# Patient Record
Sex: Male | Born: 1945 | Race: White | Hispanic: No | Marital: Single | Smoking: Former smoker
Health system: Southern US, Community
[De-identification: ages and names within clinical notes are randomized; demographics above are authoritative.]

---

## 2010-07-31 ENCOUNTER — Ambulatory Visit: Payer: Self-pay | Admitting: Internal Medicine

## 2010-08-21 ENCOUNTER — Inpatient Hospital Stay: Payer: Self-pay | Admitting: *Deleted

## 2010-08-25 ENCOUNTER — Inpatient Hospital Stay: Payer: Self-pay | Admitting: Internal Medicine

## 2010-08-31 ENCOUNTER — Ambulatory Visit: Payer: Self-pay | Admitting: Internal Medicine

## 2010-09-02 ENCOUNTER — Observation Stay: Payer: Self-pay | Admitting: Internal Medicine

## 2016-03-17 ENCOUNTER — Emergency Department
Admission: EM | Admit: 2016-03-17 | Discharge: 2016-03-17 | Payer: Medicare Other | Attending: Emergency Medicine | Admitting: Emergency Medicine

## 2016-03-17 ENCOUNTER — Encounter: Payer: Self-pay | Admitting: Emergency Medicine

## 2016-03-17 ENCOUNTER — Emergency Department: Payer: Medicare Other

## 2016-03-17 DIAGNOSIS — R4182 Altered mental status, unspecified: Secondary | ICD-10-CM | POA: Insufficient documentation

## 2016-03-17 DIAGNOSIS — Z87891 Personal history of nicotine dependence: Secondary | ICD-10-CM | POA: Insufficient documentation

## 2016-03-17 DIAGNOSIS — R740 Nonspecific elevation of levels of transaminase and lactic acid dehydrogenase [LDH]: Secondary | ICD-10-CM | POA: Insufficient documentation

## 2016-03-17 DIAGNOSIS — E876 Hypokalemia: Secondary | ICD-10-CM

## 2016-03-17 DIAGNOSIS — K8591 Acute pancreatitis with uninfected necrosis, unspecified: Secondary | ICD-10-CM | POA: Insufficient documentation

## 2016-03-17 DIAGNOSIS — R7989 Other specified abnormal findings of blood chemistry: Secondary | ICD-10-CM

## 2016-03-17 DIAGNOSIS — R109 Unspecified abdominal pain: Secondary | ICD-10-CM | POA: Diagnosis present

## 2016-03-17 DIAGNOSIS — R1084 Generalized abdominal pain: Secondary | ICD-10-CM

## 2016-03-17 LAB — LACTIC ACID, PLASMA
LACTIC ACID, VENOUS: 2.1 mmol/L — AB (ref 0.5–1.9)
LACTIC ACID, VENOUS: 1 mmol/L (ref 0.5–1.9)

## 2016-03-17 LAB — CBC WITH DIFFERENTIAL/PLATELET
BASOS ABS: 0 10*3/uL (ref 0–0.1)
BASOS PCT: 0 %
Eosinophils Absolute: 0 10*3/uL (ref 0–0.7)
Eosinophils Relative: 0 %
HEMATOCRIT: 40.2 % (ref 40.0–52.0)
HEMOGLOBIN: 14.1 g/dL (ref 13.0–18.0)
LYMPHS PCT: 11 %
Lymphs Abs: 1.2 10*3/uL (ref 1.0–3.6)
MCH: 33.4 pg (ref 26.0–34.0)
MCHC: 35.1 g/dL (ref 32.0–36.0)
MCV: 95.2 fL (ref 80.0–100.0)
Monocytes Absolute: 0.8 10*3/uL (ref 0.2–1.0)
Monocytes Relative: 7 %
NEUTROS ABS: 8.9 10*3/uL — AB (ref 1.4–6.5)
NEUTROS PCT: 82 %
Platelets: 206 10*3/uL (ref 150–440)
RBC: 4.22 MIL/uL — AB (ref 4.40–5.90)
RDW: 13.2 % (ref 11.5–14.5)
WBC: 10.9 10*3/uL — ABNORMAL HIGH (ref 3.8–10.6)

## 2016-03-17 LAB — ETHANOL

## 2016-03-17 LAB — SALICYLATE LEVEL: Salicylate Lvl: <7 mg/dL (ref 2.8–30.0)

## 2016-03-17 LAB — COMPREHENSIVE METABOLIC PANEL
ALK PHOS: 84 U/L (ref 38–126)
ALT: 14 U/L — ABNORMAL LOW (ref 17–63)
AST: 30 U/L (ref 15–41)
Albumin: 2.5 g/dL — ABNORMAL LOW (ref 3.5–5.0)
Anion gap: 11 (ref 5–15)
BUN: 11 mg/dL (ref 6–20)
CALCIUM: 8.5 mg/dL — AB (ref 8.9–10.3)
CHLORIDE: 92 mmol/L — AB (ref 101–111)
CO2: 29 mmol/L (ref 22–32)
CREATININE: 1.01 mg/dL (ref 0.61–1.24)
GFR calc Af Amer: >60 mL/min (ref >60)
Glucose, Bld: 125 mg/dL — ABNORMAL HIGH (ref 65–99)
Potassium: 2.6 mmol/L — CL (ref 3.5–5.1)
Sodium: 132 mmol/L — ABNORMAL LOW (ref 135–145)
Total Bilirubin: 1.7 mg/dL — ABNORMAL HIGH (ref 0.3–1.2)
Total Protein: 7.4 g/dL (ref 6.5–8.1)

## 2016-03-17 LAB — URINALYSIS, ROUTINE W REFLEX MICROSCOPIC
BACTERIA UA: NONE SEEN
BILIRUBIN URINE: NEGATIVE
GLUCOSE, UA: NEGATIVE mg/dL
HGB URINE DIPSTICK: NEGATIVE
Ketones, ur: 5 mg/dL — AB
LEUKOCYTES UA: NEGATIVE
NITRITE: NEGATIVE
PROTEIN: 30 mg/dL — AB
Specific Gravity, Urine: 1.015 (ref 1.005–1.030)
pH: 6 (ref 5.0–8.0)

## 2016-03-17 LAB — MAGNESIUM: Magnesium: 1.3 mg/dL — ABNORMAL LOW (ref 1.7–2.4)

## 2016-03-17 LAB — ACETAMINOPHEN LEVEL: Acetaminophen (Tylenol), Serum: <10 ug/mL — ABNORMAL LOW (ref 10–30)

## 2016-03-17 LAB — TROPONIN I: Troponin I: <0.03 ng/mL (ref <0.03)

## 2016-03-17 LAB — AMMONIA: Ammonia: 15 umol/L (ref 9–35)

## 2016-03-17 LAB — LIPASE, BLOOD: Lipase: 76 U/L — ABNORMAL HIGH (ref 11–51)

## 2016-03-17 MED ORDER — LORAZEPAM 2 MG PO TABS: 0.0000 mg | ORAL_TABLET | Freq: Four times a day (QID) | ORAL | Status: DC

## 2016-03-17 MED ORDER — SODIUM CHLORIDE 0.9 % IV BOLUS (SEPSIS)
1000.0000 mL | INTRAVENOUS | Status: AC
  Administered 2016-03-17: 1000 mL via INTRAVENOUS

## 2016-03-17 MED ORDER — IOPAMIDOL (ISOVUE-300) INJECTION 61%
30.0000 mL | Freq: Once | INTRAVENOUS | Status: AC | PRN
  Administered 2016-03-17: 30 mL via ORAL

## 2016-03-17 MED ORDER — ONDANSETRON HCL 4 MG/2ML IJ SOLN
INTRAMUSCULAR | Status: AC
  Administered 2016-03-17: 4 mg via INTRAVENOUS
  Filled 2016-03-17: qty 2

## 2016-03-17 MED ORDER — SODIUM CHLORIDE 0.9 % IV BOLUS (SEPSIS)
1250.0000 mL | INTRAVENOUS | Status: AC
  Administered 2016-03-17: 1250 mL via INTRAVENOUS

## 2016-03-17 MED ORDER — MORPHINE SULFATE (PF) 4 MG/ML IV SOLN: 4.0000 mg | Freq: Once | INTRAVENOUS | Status: DC

## 2016-03-17 MED ORDER — SODIUM CHLORIDE 0.9 % IV SOLN
30.0000 meq | Freq: Once | INTRAVENOUS | Status: AC
  Administered 2016-03-17: 30 meq via INTRAVENOUS
  Filled 2016-03-17: qty 15

## 2016-03-17 MED ORDER — MORPHINE SULFATE (PF) 4 MG/ML IV SOLN
INTRAVENOUS | Status: AC
  Administered 2016-03-17: 2 mg via INTRAVENOUS
  Filled 2016-03-17: qty 1

## 2016-03-17 MED ORDER — MAGNESIUM SULFATE 4 GM/100ML IV SOLN
4.0000 g | Freq: Once | INTRAVENOUS | Status: AC
  Administered 2016-03-17: 4 g via INTRAVENOUS
  Filled 2016-03-17: qty 100

## 2016-03-17 MED ORDER — ONDANSETRON HCL 4 MG/2ML IJ SOLN
4.0000 mg | INTRAMUSCULAR | Status: AC
  Administered 2016-03-17: 4 mg via INTRAVENOUS

## 2016-03-17 MED ORDER — THIAMINE HCL 100 MG/ML IJ SOLN
100.0000 mg | Freq: Once | INTRAMUSCULAR | Status: AC
  Administered 2016-03-17: 100 mg via INTRAVENOUS
  Filled 2016-03-17: qty 2

## 2016-03-17 MED ORDER — IOPAMIDOL (ISOVUE-300) INJECTION 61%
100.0000 mL | Freq: Once | INTRAVENOUS | Status: AC | PRN
  Administered 2016-03-17: 100 mL via INTRAVENOUS

## 2016-03-17 MED ORDER — LORAZEPAM 2 MG PO TABS: 0.0000 mg | ORAL_TABLET | Freq: Two times a day (BID) | ORAL | Status: DC

## 2016-03-17 MED ORDER — FOLIC ACID 5 MG/ML IJ SOLN
1.0000 mg | Freq: Every day | INTRAMUSCULAR | Status: DC
  Administered 2016-03-17: 1 mg via INTRAVENOUS
  Filled 2016-03-17 (×2): qty 0.2

## 2016-03-17 MED ORDER — SODIUM CHLORIDE 0.9 % IV SOLN: Freq: Once | INTRAVENOUS | Status: DC

## 2016-03-17 MED ORDER — VITAMIN B-1 100 MG PO TABS: 100.0000 mg | ORAL_TABLET | Freq: Every day | ORAL | Status: DC

## 2016-03-17 MED ORDER — MORPHINE SULFATE (PF) 4 MG/ML IV SOLN
2.0000 mg | Freq: Once | INTRAVENOUS | Status: AC
  Administered 2016-03-17: 2 mg via INTRAVENOUS

## 2016-03-17 MED ORDER — FOLIC ACID 5 MG/ML IJ SOLN: 1.0000 mg | Freq: Every day | INTRAMUSCULAR | Status: DC

## 2016-03-17 NOTE — ED Notes (Signed)
Pt resting in bed watching TV. States he does not understand why "we are torturing [him]" by not letting him have anything to drink.

## 2016-03-17 NOTE — ED Notes (Signed)
Rate changed to 9850mL/hr due to pt c/o burning. Will continue to monitor for further patient needs.

## 2016-03-17 NOTE — ED Notes (Signed)
Pt given oral contrast by Georgiann HahnKat, CT tech. Pt sitting up in bed drinking contrast at this time. This RN discussed with patient the need to obtain a urine sample, pt states "It's up to you", will monitor for urine sample and speak with MD regarding in and out cath if needed.

## 2016-03-17 NOTE — ED Notes (Addendum)
Pt resting in bed at this time. NAD noted at this time. Pt remains confused, watching TV, calls out to this RN asking for something to drink, this RN explained to patient that per MD he could not have anything to drink.

## 2016-03-17 NOTE — ED Notes (Signed)
Pt given urinal to attempt to obtain urine sample by Paulette, EDT.

## 2016-03-17 NOTE — ED Provider Notes (Addendum)
[facility] Emergency Department Provider Note  ____________________________________________   None    (approximate)  I have reviewed the triage vital signs and the nursing notes.   HISTORY  Chief Complaint Altered Mental Status  Patient appears acutely confused and is only able to provide a limited history  HPI Joshua Washington is a 70 y.o. male who was brought by EMS for further evaluation of abdominal pain.  It is unclear who called EMS this time.  EMS reports that all juice bottles filled with urine or lid or around the patient's bed as were empty cans of beer.  The patient states that he had Salmonella 9 weeks ago due to eating bad food and has been having persistent diarrhea although he states this is been a little bit better.  He has also had vomiting recently.  He reports no medical history and is intermittently cooperative.  It is unclear what his baseline mental status is.  He denies any recent fever or chills, chest pain, or shortness of breath.  He states that his abdomen has been hurting "for a long time" but is unable to further quantify.  He states it hurts when we push on it and nothing in particular makes it better.   History reviewed. No pertinent past medical history.  There are no active problems to display for this patient.   History reviewed. No pertinent surgical history.  Prior to Admission medications   Not on File    Allergies Patient has no known allergies.  No family history on file.  Social History Social History  Substance Use Topics  . Smoking status: Former Games developer  . Smokeless tobacco: Never Used  . Alcohol use Yes     Comment: unknown how much patient drinks or when last drink was.    Review of Systems (limited by confusion) Level 5 caveat:  AMS/confusion, unwilling or unable to provide reliable/complete ROS ____________________________________________   PHYSICAL EXAM:  VITAL SIGNS: ED Triage Vitals  Enc  Vitals Group     BP      Pulse      Resp      Temp      Temp src      SpO2      Weight      Height      Head Circumference      Peak Flow      Pain Score      Pain Loc      Pain Edu?      Excl. in GC?     Constitutional: Alert, oriented to self and place.  Extremely disheveled, terrible hygiene, malodorous. Eyes: Conjunctivae are normal. PERRL. EOMI. Head: Atraumatic. Nose: No congestion/rhinnorhea. Mouth/Throat: Edentulous.  Mucous membranes are dry.   Neck: No stridor.  No meningeal signs.   Cardiovascular: Tachycardia in 130s, regular rhythm. Good peripheral circulation. Grossly normal heart sounds. Respiratory: Normal respiratory effort.  No retractions. Lungs CTAB. Gastrointestinal: Soft with generalized tenderness throughout abdomen with possibly mild distention.   Musculoskeletal: No lower extremity tenderness nor edema. No gross deformities of extremities. Neurologic:  Slurred speech. No gross focal neurologic deficits are appreciated.  Skin:  Skin is warm, dry and intact. No rash noted.   ____________________________________________   LABS (all labs ordered are listed, but only abnormal results are displayed)  Labs Reviewed  COMPREHENSIVE METABOLIC PANEL - Abnormal; Notable for the following:       Result Value   Sodium 132 (*)    Potassium 2.6 (*)  Chloride 92 (*)    Glucose, Bld 125 (*)    Calcium 8.5 (*)    Albumin 2.5 (*)    ALT 14 (*)    Total Bilirubin 1.7 (*)    All other components within normal limits  LIPASE, BLOOD - Abnormal; Notable for the following:    Lipase 76 (*)    All other components within normal limits  ACETAMINOPHEN LEVEL - Abnormal; Notable for the following:    Acetaminophen (Tylenol), Serum <10 (*)    All other components within normal limits  LACTIC ACID, PLASMA - Abnormal; Notable for the following:    Lactic Acid, Venous 2.1 (*)    All other components within normal limits  CBC WITH DIFFERENTIAL/PLATELET - Abnormal;  Notable for the following:    WBC 10.9 (*)    RBC 4.22 (*)    Neutro Abs 8.9 (*)    All other components within normal limits  URINALYSIS, ROUTINE W REFLEX MICROSCOPIC - Abnormal; Notable for the following:    Color, Urine AMBER (*)    APPearance CLEAR (*)    Ketones, ur 5 (*)    Protein, ur 30 (*)    Squamous Epithelial / LPF 0-5 (*)    All other components within normal limits  MAGNESIUM - Abnormal; Notable for the following:    Magnesium 1.3 (*)    All other components within normal limits  CULTURE, BLOOD (ROUTINE X 2)  CULTURE, BLOOD (ROUTINE X 2)  ETHANOL  SALICYLATE LEVEL  AMMONIA  TROPONIN I  LACTIC ACID, PLASMA   ____________________________________________  EKG  ED ECG REPORT I, Zowie Lundahl, the attending physician, personally viewed and interpreted this ECG.  Date: 03/17/2016 EKG Time: 14:05 Rate: 126 Rhythm: sinus tachycardia QRS Axis: normal Intervals: normal ST/T Wave abnormalities: Some apparent ST segment elevation in lead V2 but there are no reciprocal changes and is only in one lead.  Overall there is no evidence of acute ischemia Conduction Disturbances: none   ____________________________________________  RADIOLOGY   Ct Head Wo Contrast  Result Date: 03/17/2016 CLINICAL DATA:  Altered mental status with confusion. Recent falls. Generalized abdominal pain. EXAM: CT HEAD WITHOUT CONTRAST TECHNIQUE: Contiguous axial images were obtained from the base of the skull through the vertex without intravenous contrast. COMPARISON:  CT head 08/21/2010. FINDINGS: Brain: There is stable mild generalized atrophy with prominence of the subarachnoid spaces, especially adjacent to the frontal lobes. No evidence of acute intracranial hemorrhage, mass lesion, brain edema or new extra-axial fluid collection. There is no hydrocephalus. There are stable mild chronic small vessel ischemic changes in the periventricular white matter. There is no CT evidence of acute  cortical infarction. Vascular: Intracranial vascular calcifications. No hyperdense vascular sign. Skull: Negative for fracture or focal lesion. Sinuses/Orbits: The visualized paranasal sinuses and mastoid air cells are clear. No orbital abnormalities are seen. Other: None. IMPRESSION: No acute intracranial findings. Stable atrophy and chronic small vessel ischemic changes. Electronically Signed   By: Carey Bullocks M.D.   On: 03/17/2016 19:54   Ct Abdomen Pelvis W Contrast  Result Date: 03/17/2016 CLINICAL DATA:  Abdominal pain.  Elevated lactate. EXAM: CT ABDOMEN AND PELVIS WITH CONTRAST TECHNIQUE: Multidetector CT imaging of the abdomen and pelvis was performed using the standard protocol following bolus administration of intravenous contrast. CONTRAST:  ISOVUE-300 IOPAMIDOL (ISOVUE-300) INJECTION 61% COMPARISON:  CT chest 09/01/2010. FINDINGS: Lower chest: No pulmonary nodules. No visible pleural or pericardial effusion. Small hiatal hernia. Hepatobiliary: Normal hepatic size and contours without focal  liver lesion. No perihepatic ascites. There is mild intrahepatic biliary dilatation, as well as extrahepatic biliary dilatation with the common bile duct measuring up to 1.3 cm. Gallbladder is distended without cholelithiasis. Pancreas: There is loss of normal pancreatic enhancement in the pancreatic head and body. There is a heterogeneous, predominantly low density collection within and surrounding the pancreas that measures approximately 1.7 x 6.4 cm. Portions of the collection appear encapsulated, but most of it is not encapsulated. The collection extends laterally to the splenic hilum. Spleen: Normal. Adrenals/Urinary Tract: Normal adrenal glands. Nonobstructing left nephrolithiasis measures 6 mm. No hydronephrosis or solid renal mass. Stomach/Bowel: No evidence of small-bowel obstruction or acute inflammation. Normal appendix. Vascular/Lymphatic: There is atherosclerotic calcification of the non  aneurysmal abdominal aorta. The main portal vein is patent. The superior mesenteric vein is not visualized and may be occluded. The splenic vein is narrowed but appears to be patent. No abdominal or pelvic adenopathy. Reproductive: There are prostatic calcifications with mild enlargement of the prostate gland. Seminal vesicles are normal. There is a small amount of free fluid in the pelvis. Musculoskeletal: There is multilevel lumbar facet arthrosis and osteophytosis. No focal lytic or blastic lesions. There are prominent Schmorl's nodes at multiple levels. There is grade 1 anterolisthesis of L5 on S1 secondary to bilateral defects of the pars interarticularis. Normal visualized extrathoracic and extraperitoneal soft tissues. Other: No contributory non-categorized findings. IMPRESSION: 1. Areas of necrosis within the pancreatic head and body with associated large, predominantly non-encapsulated pancreatic/peripancreatic fluid collection, most consistent with an acute necrotic collection. 2. Nonvisualization of the superior mesenteric vein, which may be occluded. 3. Intra- and extrahepatic biliary dilatation with common bile duct measuring 13 mm. 4. Aortic atherosclerosis. Electronically Signed   By: Deatra Robinson M.D.   On: 03/17/2016 20:09   Dg Chest Portable 1 View  Result Date: 03/17/2016 CLINICAL DATA:  Left quadrant abdominal pain. EXAM: PORTABLE CHEST 1 VIEW COMPARISON:  Aug 29, 2010 FINDINGS: The heart size and mediastinal contours are within normal limits. Both lungs are clear. The visualized skeletal structures are unremarkable. IMPRESSION: No active disease. Electronically Signed   By: Gerome Sam III M.D   On: 03/17/2016 14:37    ____________________________________________   PROCEDURES  Procedure(s) performed:   .Critical Care Performed by: Loleta Rose Authorized by: Loleta Rose   Critical care provider statement:    Critical care time (minutes):  60   Critical care time was  exclusive of:  Separately billable procedures and treating other patients   Critical care was necessary to treat or prevent imminent or life-threatening deterioration of the following conditions:  Endocrine crisis and metabolic crisis   Critical care was time spent personally by me on the following activities:  Development of treatment plan with patient or surrogate, discussions with consultants, evaluation of patient's response to treatment, examination of patient, obtaining history from patient or surrogate, ordering and performing treatments and interventions, ordering and review of laboratory studies, ordering and review of radiographic studies, pulse oximetry, re-evaluation of patient's condition and review of old charts      Critical Care performed:  Yes  ____________________________________________   INITIAL IMPRESSION / ASSESSMENT AND PLAN / ED COURSE  Pertinent labs & imaging results that were available during my care of the patient were reviewed by me and considered in my medical decision making (see chart for details).  Patient altered and unable to provide much history, states he "brings hell with me wherever I go."  Tachycardia, disheveled, dirty, drinking large  amount of beer at home per EMS.  Broad evaluation/workup including early administration of thiamine and folic, NS bolus, extensive lab work.   Clinical Course as of Mar 18 2111  Wynelle LinkSun Mar 17, 2016  1558 The patient's labs are notable for a mildly elevated lipase at 76.  A lactic acid of 2.1, potassium of 2.6, and otherwise essentially normal LFTs except for a slight elevation of bilirubin of 1.7.  He does not have an elevated ammonia and has no significant leukocytosis.  He is extremely tenderness to palpation of the abdomen which may be consistent with pancreatitis.  We will evaluate with CT scan of the abdomen and pelvis, continue with 30 mL per kilogram of normal saline, and I anticipate admission.  HR improved to about  115-120 after 1L NS.  Patient did receive his thiamine and folic acid.  No evidence of a colic ketoacidosis with a normal anion gap.  [CF]  1837 UA unremarkable  [CF]  1839 Ethanol negative, questionable ETOH withdrawal symptoms.  Will monitor carefully for need for benzos.  [CF]  2021 Multiple abnormalities on CT scan.  Remains hemodynamically stable at this time.  Will admit to hospitalist. CT ABDOMEN PELVIS W CONTRAST [CF]  2024 Due to multiple critically ill patients in the ED at the time, we do not yet have a repeat lactic acid.  I requested that it be drawn as soon as we have the opportunity.   [CF]  2025 Paged hospitalist for admission  [CF]  2028 I consider giving empiric antibiotics, but the patient has no sign of an infectious process.  He has no leukocytosis and no fever.  He has tachycardia but that is likely due to his necrotizing pancreatitis.  I do not feel that antibiotics are indicated at this point and will defer to hospitalist and GI specialist if they feel it is indicated.  However at this time there is no indication of extrapancreatic infection.  Drawing blood cultures just in case.  [CF]  2041 Spoke with hospitalist (Dr. Anne HahnWillis) who asked that I call GI to make sure they can care for the patient at this facility.  Asked Diplomatic Services operational officersecretary to page Dr. Earlean PolkaSolik.  [CF]  2056 Dr. Earlean PolkaSolik (GI) feels that the patient needs a large academic center based on the degree on complications associated with the patient's diagnosis.  He feels that [facility] nor [facility] have the capabilities to care for him.  I will call [facility] to request transfer.  [CF]  2112 [facility].  [facility].   [CF]    Clinical Course User Index [CF] Loleta Roseory Djuana Littleton, MD    ____________________________________________  FINAL CLINICAL IMPRESSION(S) / ED DIAGNOSES  Final diagnoses:  Elevated lactic acid level  Hypokalemia  Hypomagnesemia  Generalized  abdominal pain  Pancreatitis, necrotizing     MEDICATIONS GIVEN DURING THIS VISIT:  Medications  folic acid 1 mg in sodium chloride 0.9 % 50 mL IVPB (1 mg Intravenous Given 03/17/16 1515)  0.9 %  sodium chloride infusion (not administered)  LORazepam (ATIVAN) tablet 0-4 mg (not administered)    Followed by  LORazepam (ATIVAN) tablet 0-4 mg (not administered)  thiamine (VITAMIN B-1) tablet 100 mg (not administered)  thiamine (B-1) injection 100 mg (100 mg Intravenous Given 03/17/16 1515)  sodium chloride 0.9 % bolus 1,000 mL (0 mLs Intravenous Stopped 03/17/16 1559)  sodium chloride 0.9 % bolus 1,250 mL (0 mLs Intravenous Stopped 03/17/16  2056)  ondansetron (ZOFRAN) injection 4 mg (4 mg Intravenous Given 03/17/16 1555)  morphine 4 MG/ML injection 2 mg (2 mg Intravenous Given 03/17/16 1556)  potassium chloride 30 mEq in sodium chloride 0.9 % 265 mL (KCL MULTIRUN) IVPB (30 mEq Intravenous Given 03/17/16 1717)  iopamidol (ISOVUE-300) 61 % injection 30 mL (30 mLs Oral Contrast Given 03/17/16 1603)  magnesium sulfate IVPB 4 g 100 mL (4 g Intravenous New Bag/Given 03/17/16 1840)  iopamidol (ISOVUE-300) 61 % injection 100 mL (100 mLs Intravenous Contrast Given 03/17/16 1924)     NEW OUTPATIENT MEDICATIONS STARTED DURING THIS VISIT:  New Prescriptions   No medications on file    Modified Medications   No medications on file    Discontinued Medications   No medications on file     Note:  This document was prepared using Dragon voice recognition software and may include unintentional dictation errors.    Loleta Roseory Sindy Mccune, MD 03/17/16 2034    Loleta Roseory Emersynn Deatley, MD 03/17/16 2112

## 2016-03-17 NOTE — Clinical Social Work Note (Addendum)
CSW received consult concerning self-neglect. CSW contacted the patient's emergency contact as the patient is refusing to discuss his medical or living conditions with staff. The patient's brother answered and indicated that he was not aware that the patient is living in those conditions, but that he was not surprised. The CSW advised the patient's brother that due to the description by EMS and the patient's personal hygiene at this time, a mandated report would have to be made to APS. The patient's brother laughed and said "OK." CSW is following and has placed a mandated report with Misty StanleyLisa, the on-call for APS this weekend. She indicated that they will follow-up most likely 03/18/2016.  Joshua Washington, MSW, LCSW-A [phone]

## 2016-03-17 NOTE — ED Triage Notes (Signed)
Pt presents to ED via [facility]. Per EMS pt was found sitting on side of bed with a floor littered with old [employer] cans. Per EMS pt had old juice bottles that were around his bed, EMS states pt had been using them as urinals, urine was noted to be dark and cloudy. EMS reports that pt had possible salmonella approx 9 weeks ago due to patient eating food out of a can and had vomiting and diarrhea. EMS reports no known allergies and no known medical history, patient is not cooperative at this time to answer questions about medical history or allergies. EMS also reports that patient went from 15 PBR cans of beer a day to 1 can of beer a day. Pt is noted to be altered, general hygeine is considered poor at best, skin is noted to be dry and flaky all over his body. Pt has approx 3 ft dread lock in his beard, and his hair is also noted to be matted at this time. Pt states he is rude to "silly hospital people", unable to establish if this is patient's baseline at this time.

## 2016-03-17 NOTE — ED Notes (Signed)
Fall mats placed around patient's bed at this time, high fall risk bracelet placed to patient's R wrist, fall alarm placed on patient at this time.

## 2016-03-17 NOTE — ED Notes (Signed)
Report given to [place], Charity fundraiserN.

## 2016-03-17 NOTE — ED Notes (Signed)
Pt returned from CT at this time.  

## 2016-03-17 NOTE — ED Notes (Signed)
Pt taken to CT at this time.

## 2016-03-17 NOTE — ED Notes (Signed)
Pt noted to be sitting up in bed drinking contrast at this time. Pt states "the contrast is nasty", this RN encouraged patient to drink contrast. VSS at this time.

## 2016-03-22 LAB — CULTURE, BLOOD (ROUTINE X 2)
Culture: NO GROWTH
Culture: NO GROWTH

## 2018-06-25 IMAGING — CT CT HEAD W/O CM
3 series · 14 of 47 positions shown, 16 images · non-contrast
Comparison: CT head 08/21/2010.

CLINICAL DATA: Altered mental status with confusion. Recent falls.
Generalized abdominal pain.

EXAM:
CT HEAD WITHOUT CONTRAST
TECHNIQUE: Contiguous axial images were obtained from the base of the skull
through the vertex without intravenous contrast.

[Series 2: head wo · axial · 0.47mm/px · z∈[-129,-4]mm · 8 of 30 slices shown, 10 images]
[im 3/30  brain]
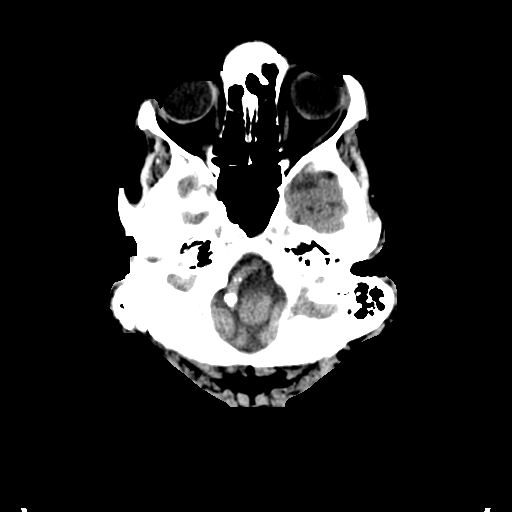
[im 3/30  bone]
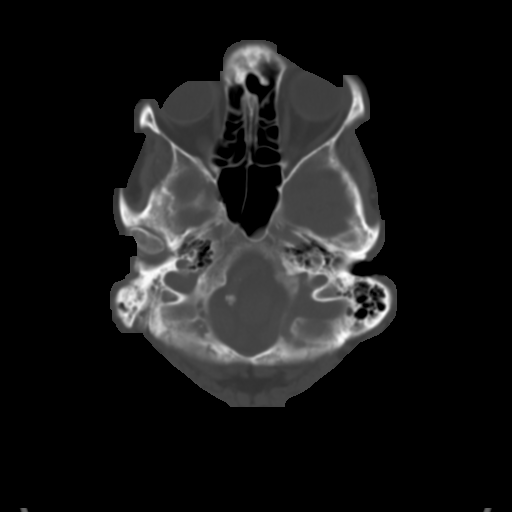
[im 7/30  brain]
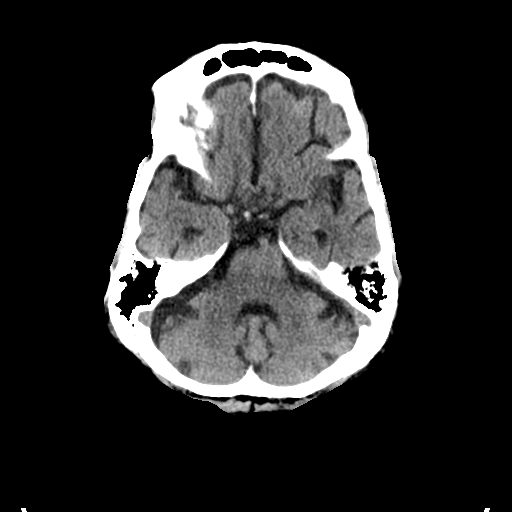
[im 10/30  brain]
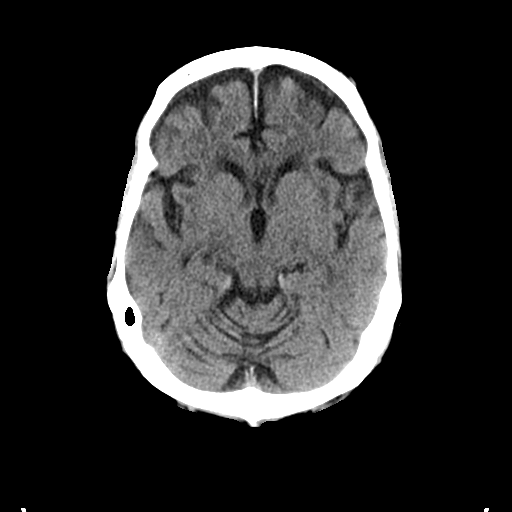
[im 14/30  brain]
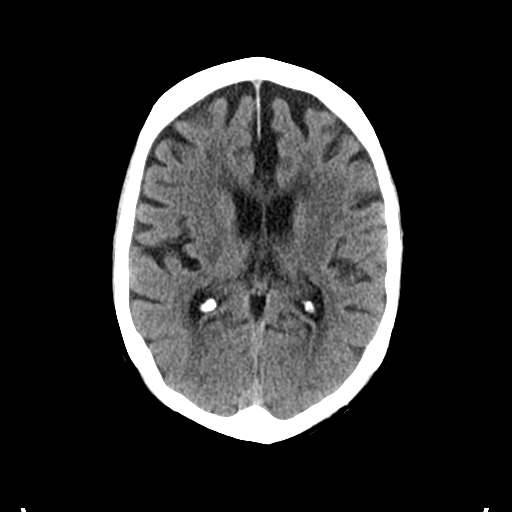
[im 17/30  brain]
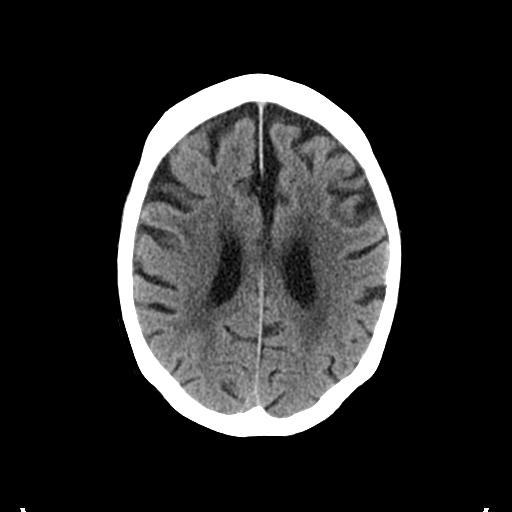
[im 17/30  bone]
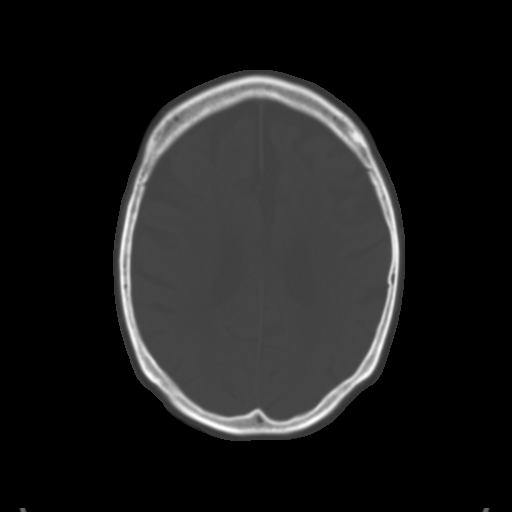
[im 21/30  brain]
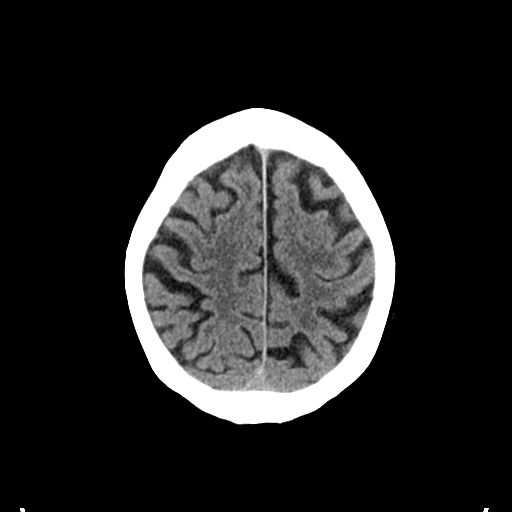
[im 24/30  brain]
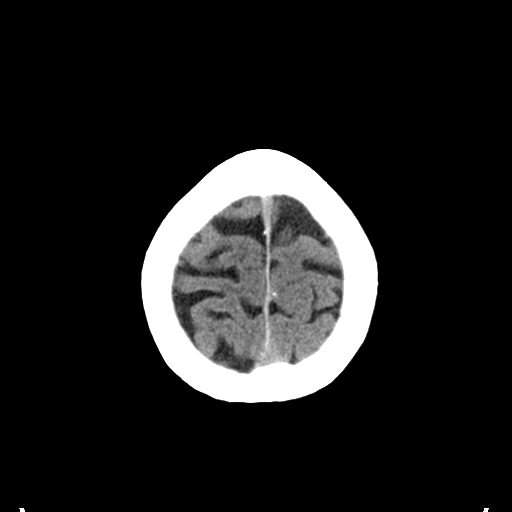
[im 28/30  brain]
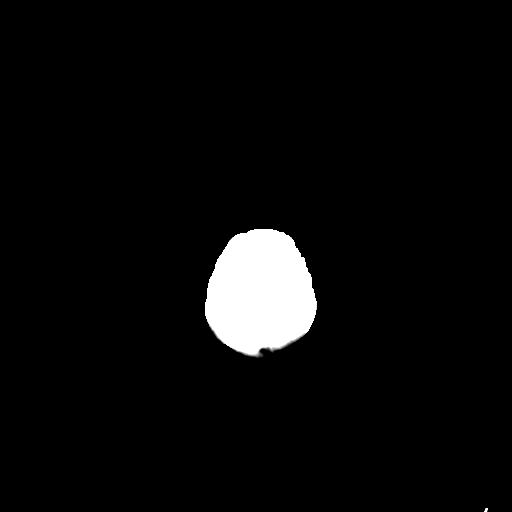

[Series 4: coronal soft tissue · coronal · 0.29mm/px · 3 of 60 slices shown]
[im 20/60  brain]
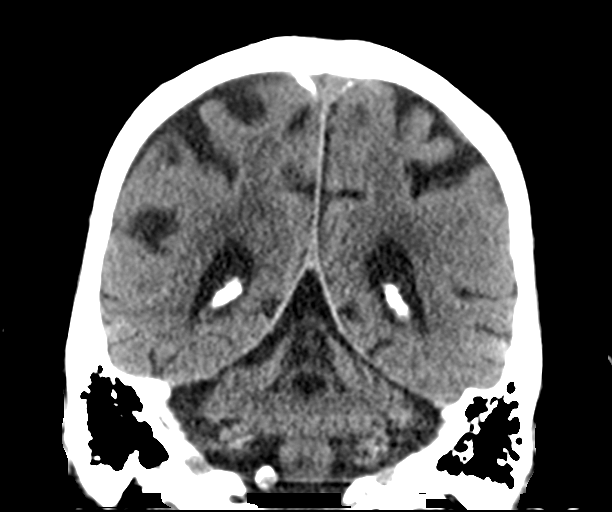
[im 27/60  brain]
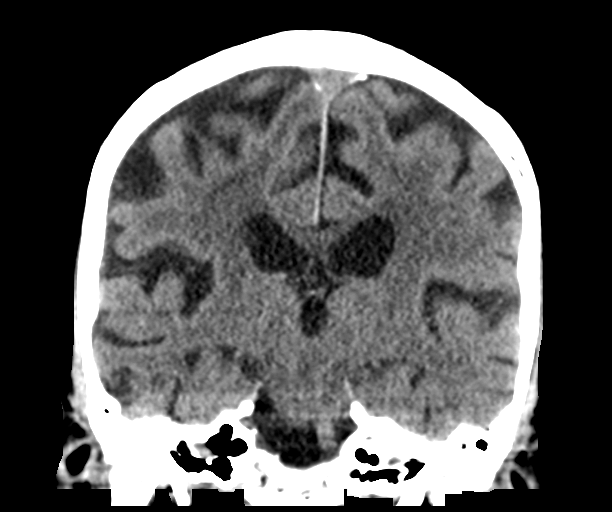
[im 33/60  brain]
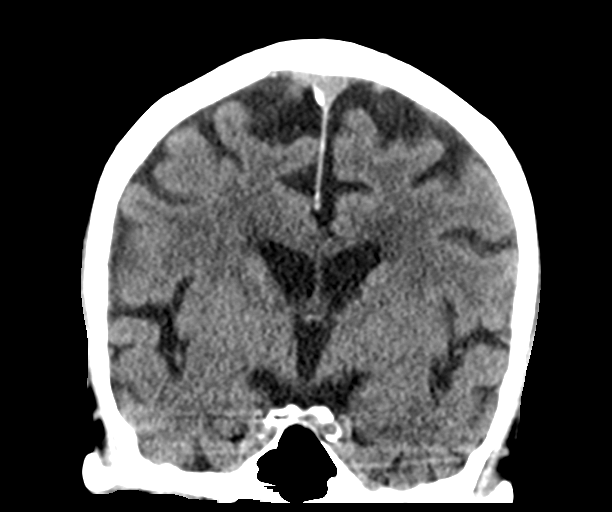

[Series 5: sagittal soft tissue · sagittal · 0.29mm/px · 3 of 46 slices shown]
[im 16/46  brain]
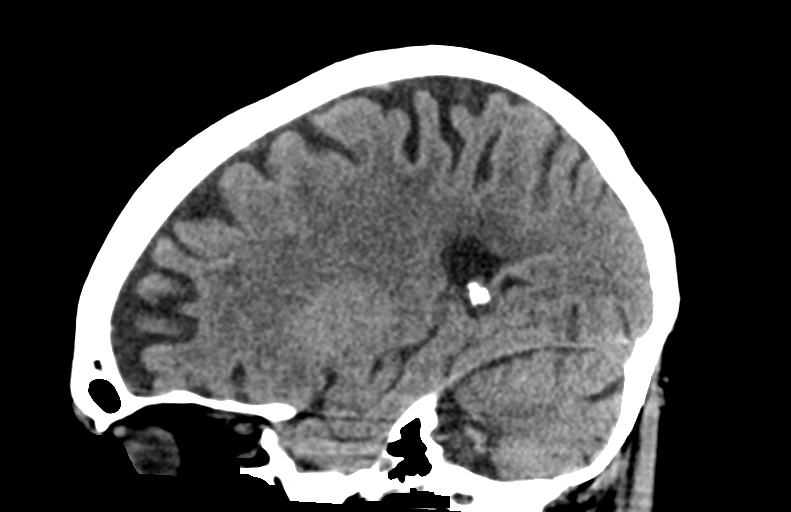
[im 23/46  brain]
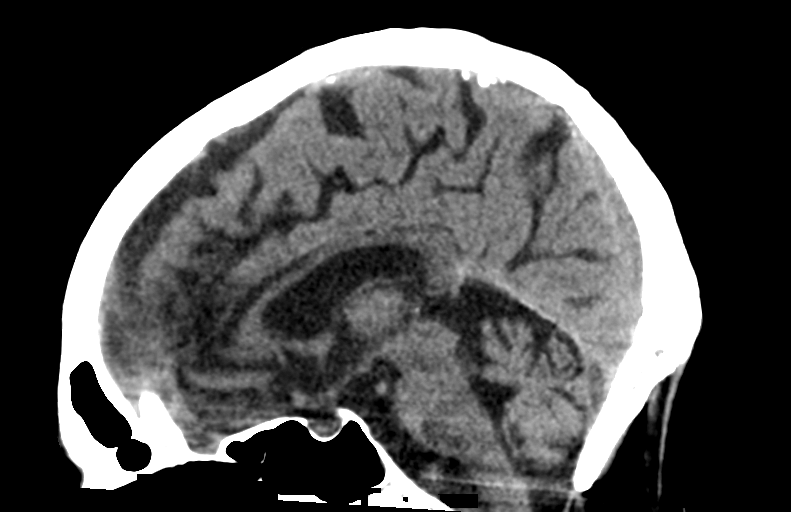
[im 31/46  brain]
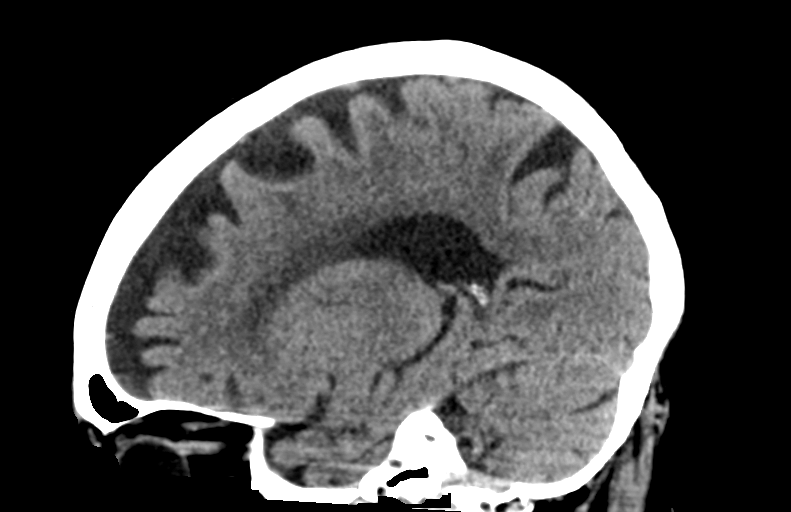

[14 of 47 positions shown; findings below may reference images not displayed]

FINDINGS: Brain: There is stable mild generalized atrophy with prominence of
the subarachnoid spaces, especially adjacent to the frontal lobes.
No evidence of acute intracranial hemorrhage, mass lesion, brain
edema or new extra-axial fluid collection. There is no
hydrocephalus. There are stable mild chronic small vessel ischemic
changes in the periventricular white matter. There is no CT evidence
of acute cortical infarction.

Vascular: Intracranial vascular calcifications. No hyperdense
vascular sign.

Skull: Negative for fracture or focal lesion.

Sinuses/Orbits: The visualized paranasal sinuses and mastoid air
cells are clear. No orbital abnormalities are seen.

Other: None.
IMPRESSION: No acute intracranial findings. Stable atrophy and chronic small
vessel ischemic changes.

## 2018-06-25 IMAGING — CT CT ABD-PELV W/ CM
2 of 5 series · 15 of 46 positions shown, 17 images · IV contrast (APPLIED)
Comparison: CT chest 09/01/2010.

CLINICAL DATA: Abdominal pain.  Elevated lactate.

EXAM:
CT ABDOMEN AND PELVIS WITH CONTRAST
TECHNIQUE: Multidetector CT imaging of the abdomen and pelvis was performed
using the standard protocol following bolus administration of
intravenous contrast.
CONTRAST:  100mL ZGTZHT-D44 IOPAMIDOL (ZGTZHT-D44) INJECTION 61%

[Series 2: routine abd/pel with · axial · 0.78mm/px · z∈[-469,-54]mm · 12 of 95 slices shown, 14 images]
[im 6/95  soft-tissue]
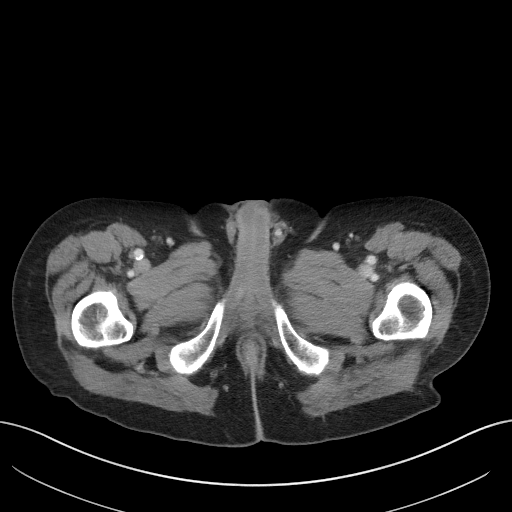
[im 6/95  bone]
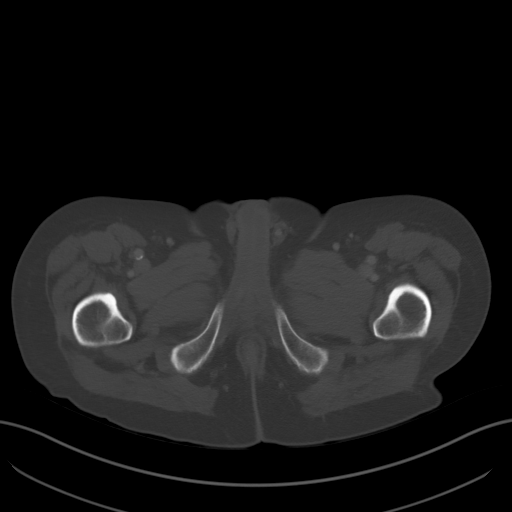
[im 16/95  soft-tissue]
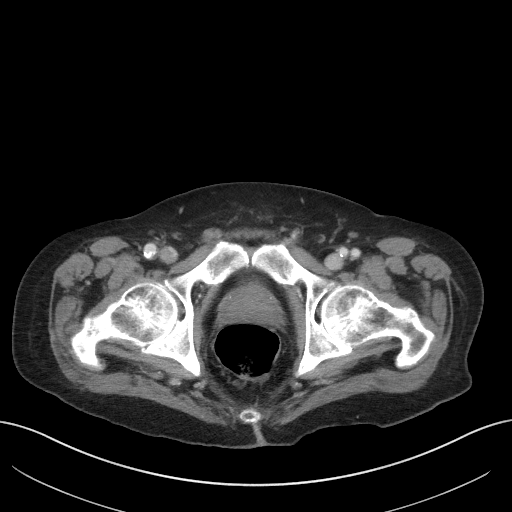
[im 21/95  soft-tissue]
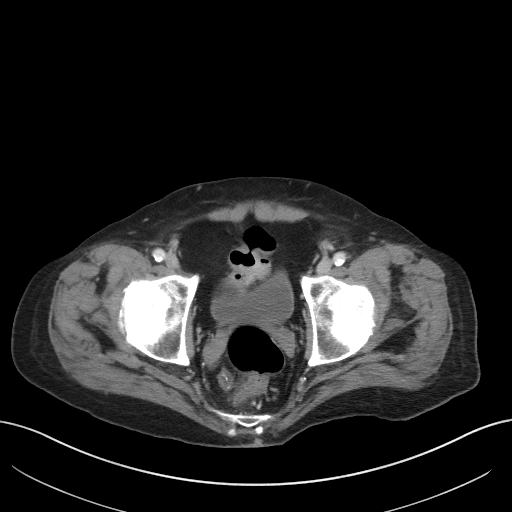
[im 27/95  soft-tissue]
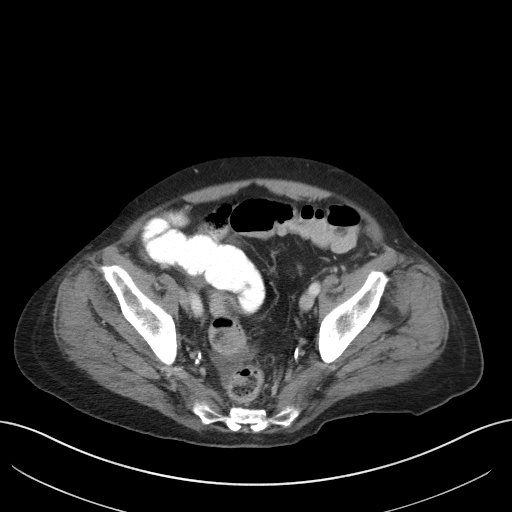
[im 37/95  soft-tissue]
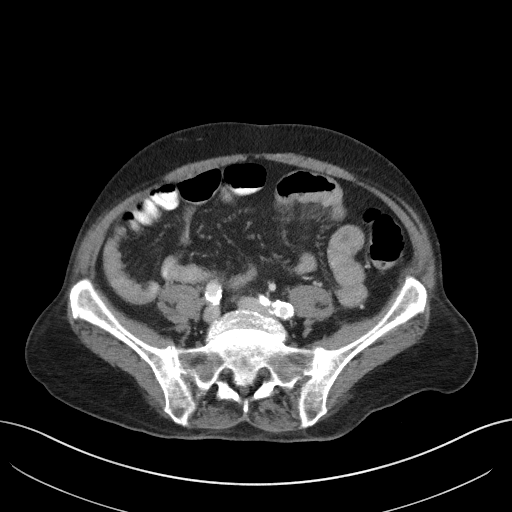
[im 42/95  soft-tissue]
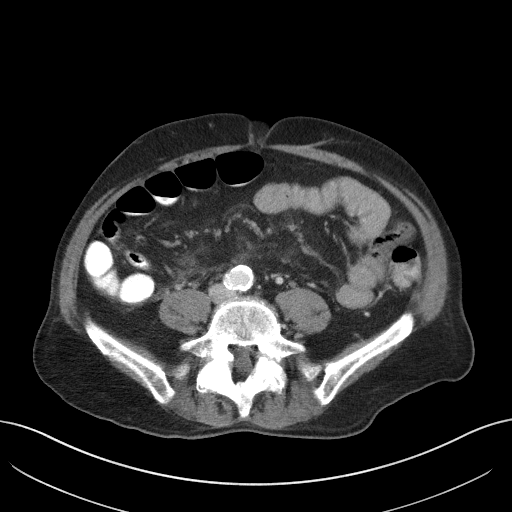
[im 53/95  soft-tissue]
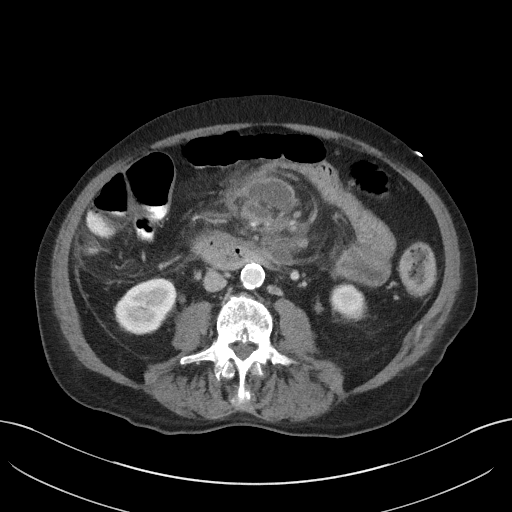
[im 58/95  soft-tissue]
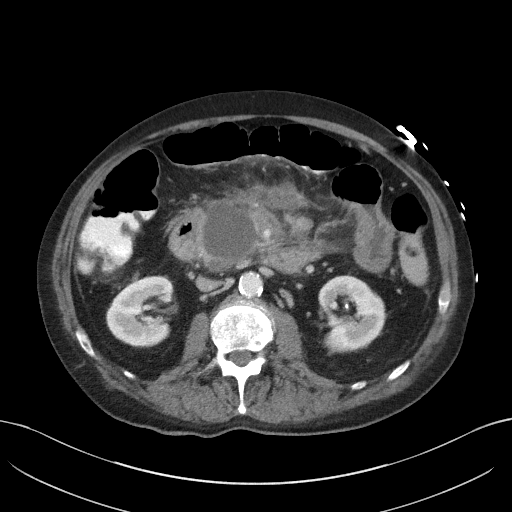
[im 68/95  soft-tissue]
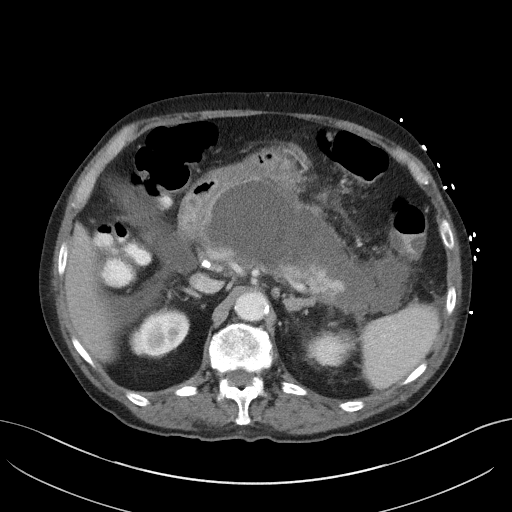
[im 68/95  bone]
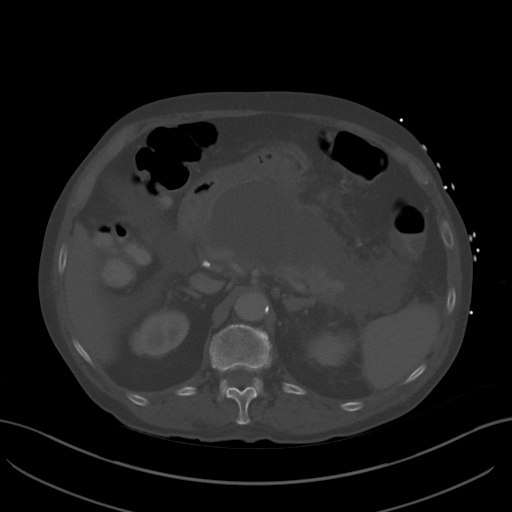
[im 74/95  soft-tissue]
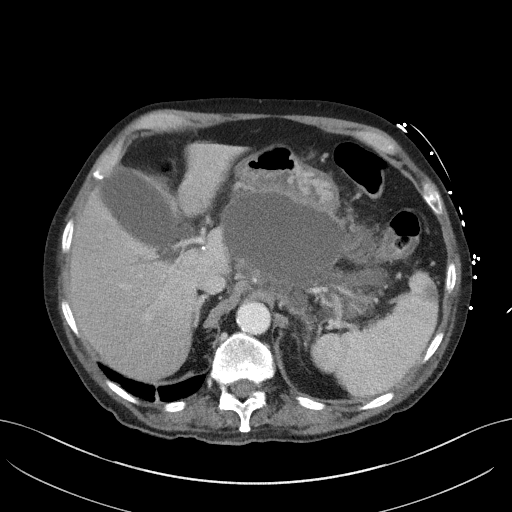
[im 79/95  soft-tissue]
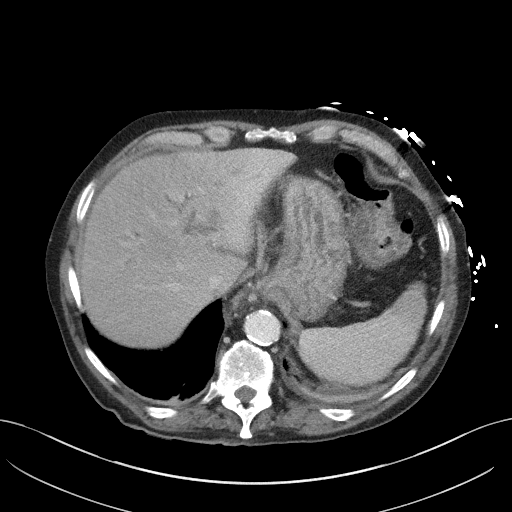
[im 89/95  soft-tissue]
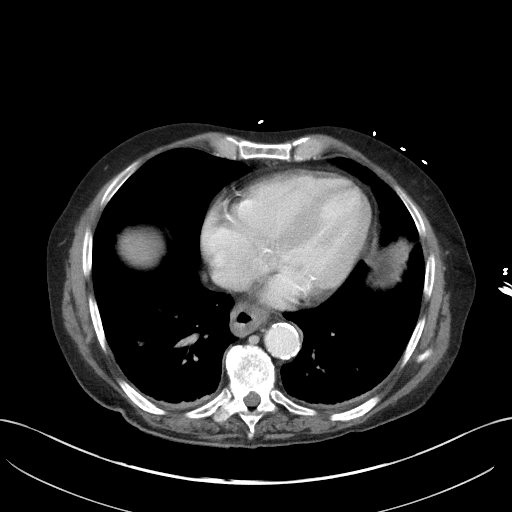

[Series 5: coronal st · coronal · 0.74mm/px · 3 of 87 slices shown]
[im 29/87  soft-tissue]
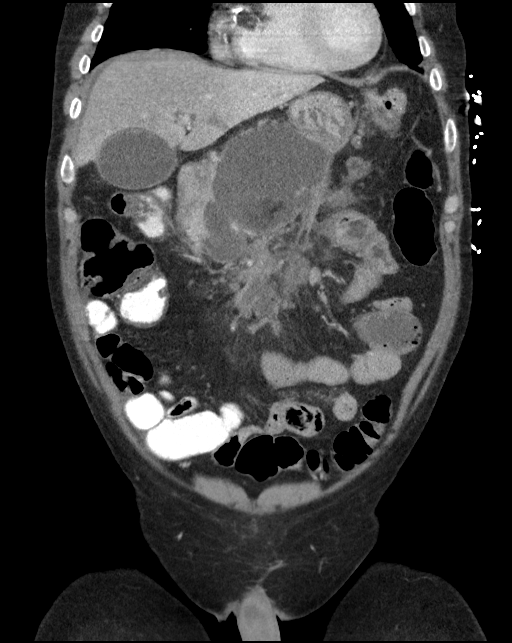
[im 39/87  soft-tissue]
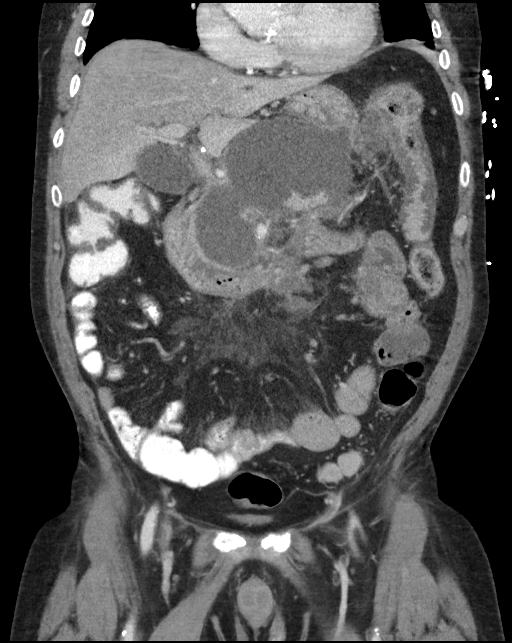
[im 48/87  soft-tissue]
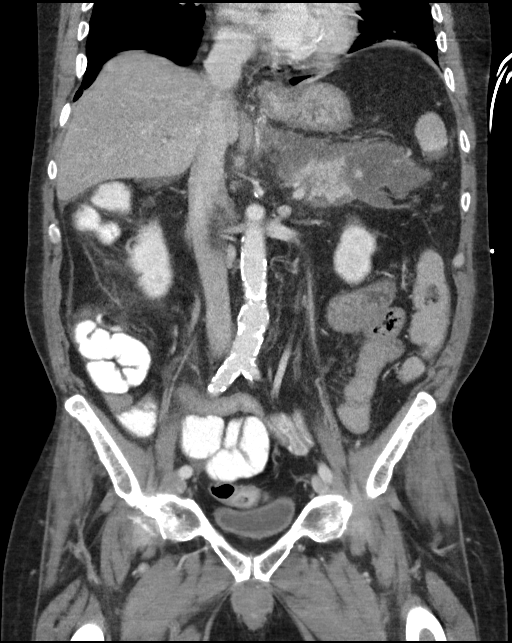

[15 of 46 positions shown; findings below may reference images not displayed]

FINDINGS: Lower chest: No pulmonary nodules. No visible pleural or pericardial
effusion. Small hiatal hernia.

Hepatobiliary: Normal hepatic size and contours without focal liver
lesion. No perihepatic ascites. There is mild intrahepatic biliary
dilatation, as well as extrahepatic biliary dilatation with the
common bile duct measuring up to 1.3 cm. Gallbladder is distended
without cholelithiasis.

Pancreas: There is loss of normal pancreatic enhancement in the
pancreatic head and body. There is a heterogeneous, predominantly
low density collection within and surrounding the pancreas that
measures approximately 1.7 x 6.4 cm. Portions of the collection
appear encapsulated, but most of it is not encapsulated. The
collection extends laterally to the splenic hilum.

Spleen: Normal.

Adrenals/Urinary Tract: Normal adrenal glands. Nonobstructing left
nephrolithiasis measures 6 mm. No hydronephrosis or solid renal
mass.

Stomach/Bowel: No evidence of small-bowel obstruction or acute
inflammation. Normal appendix.

Vascular/Lymphatic: There is atherosclerotic calcification of the
non aneurysmal abdominal aorta. The main portal vein is patent. The
superior mesenteric vein is not visualized and may be occluded. The
splenic vein is narrowed but appears to be patent. No abdominal or
pelvic adenopathy.

Reproductive: There are prostatic calcifications with mild
enlargement of the prostate gland. Seminal vesicles are normal.
There is a small amount of free fluid in the pelvis.

Musculoskeletal: There is multilevel lumbar facet arthrosis and
osteophytosis. No focal lytic or blastic lesions. There are
prominent Schmorl's nodes at multiple levels. There is grade 1
anterolisthesis of L5 on S1 secondary to bilateral defects of the
pars interarticularis. Normal visualized extrathoracic and
extraperitoneal soft tissues.

Other: No contributory non-categorized findings.
IMPRESSION: 1. Areas of necrosis within the pancreatic head and body with
associated large, predominantly non-encapsulated
pancreatic/peripancreatic fluid collection, most consistent with an
acute necrotic collection.
2. Nonvisualization of the superior mesenteric vein, which may be
occluded.
3. Intra- and extrahepatic biliary dilatation with common bile duct
measuring 13 mm.
4. Aortic atherosclerosis.

## 2019-11-29 ENCOUNTER — Ambulatory Visit: Payer: Medicare Other

## 2019-12-14 ENCOUNTER — Ambulatory Visit: Payer: Self-pay | Attending: Internal Medicine

## 2019-12-14 DIAGNOSIS — Z23 Encounter for immunization: Secondary | ICD-10-CM

## 2019-12-14 NOTE — Progress Notes (Signed)
   Covid-19 Vaccination Clinic  Name:  Joshua Washington    MRN: [id number] DOB: [birth year]  12/14/2019  [name] was observed post Covid-19 immunization for 15 minutes without incident. He was provided with Vaccine Information Sheet and instruction to access the V-Safe system.   [name] was instructed to call 911 with any severe reactions post vaccine: Marland Kitchen Difficulty breathing  . Swelling of face and throat  . A fast heartbeat  . A bad rash all over body  . Dizziness and weakness   Immunizations Administered    Name Date Dose VIS Date Route   Pfizer COVID-19 Vaccine 12/14/2019 11:35 AM 0.3 mL 05/26/2018 Intramuscular   Manufacturer: [employer], [employer]   Lot: [id number]   NDC: [address]-7

## 2020-01-04 ENCOUNTER — Ambulatory Visit: Payer: Medicare Other | Attending: Internal Medicine

## 2020-01-04 DIAGNOSIS — Z23 Encounter for immunization: Secondary | ICD-10-CM

## 2020-01-04 NOTE — Progress Notes (Signed)
° °  Covid-19 Vaccination Clinic  Name:  Rashi Giuliani    MRN: [id number] DOB: [birth year]  01/04/2020  Mr. Hara was observed post Covid-19 immunization for 15 minutes without incident. He was provided with Vaccine Information Sheet and instruction to access the V-Safe system.   Mr. Kurtzman was instructed to call 911 with any severe reactions post vaccine:  Difficulty breathing   Swelling of face and throat   A fast heartbeat   A bad rash all over body   Dizziness and weakness   Immunizations Administered    Name Date Dose VIS Date Route   Pfizer COVID-19 Vaccine 01/04/2020 11:30 AM 0.3 mL 05/26/2018 Intramuscular   Manufacturer: [employer], [employer]   Lot: [id number]   NDC: [address]-7

## 2020-07-28 ENCOUNTER — Ambulatory Visit: Payer: Medicare Other | Attending: Internal Medicine

## 2020-07-28 ENCOUNTER — Other Ambulatory Visit: Payer: Self-pay

## 2020-07-28 DIAGNOSIS — Z23 Encounter for immunization: Secondary | ICD-10-CM

## 2020-07-28 MED ORDER — PFIZER-BIONT COVID-19 VAC-TRIS 30 MCG/0.3ML IM SUSP
INTRAMUSCULAR | 0 refills | Status: AC
  Filled 2020-07-28: qty 0.3, 1d supply, fill #0

## 2020-07-28 NOTE — Progress Notes (Signed)
   Covid-19 Vaccination Clinic  Name:  Joshua Washington    MRN: [id number] DOB: Jul 26, 1945  07/28/2020  Joshua Washington was observed post Covid-19 immunization for 15 minutes without incident. He was provided with Vaccine Information Sheet and instruction to access the V-Safe system.   Joshua Washington was instructed to call 911 with any severe reactions post vaccine: Marland Kitchen Difficulty breathing  . Swelling of face and throat  . A fast heartbeat  . A bad rash all over body  . Dizziness and weakness   Immunizations Administered    Name Date Dose VIS Date Route   PFIZER Comrnaty(Gray TOP) Covid-19 Vaccine 07/28/2020  1:03 PM 0.3 mL 03/09/2020 Intramuscular   Manufacturer: [employer], [employer]   Lot: [id number]   NDC: [phone]

## 2020-11-30 DEATH — deceased

## 2022-11-07 ENCOUNTER — Other Ambulatory Visit (HOSPITAL_COMMUNITY): Payer: Self-pay

## 2022-12-11 ENCOUNTER — Other Ambulatory Visit (HOSPITAL_COMMUNITY): Payer: Self-pay

## 2023-01-01 ENCOUNTER — Other Ambulatory Visit (HOSPITAL_COMMUNITY): Payer: Self-pay
